# Patient Record
Sex: Male | Born: 2000
Health system: Southern US, Community
[De-identification: ages and names within clinical notes are randomized; demographics above are authoritative.]

---
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
## 2021-11-14 DIAGNOSIS — H5213 Myopia, bilateral: Secondary | ICD-10-CM | POA: Diagnosis not present
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
## 2022-09-13 ENCOUNTER — Encounter: Payer: Self-pay | Admitting: Student

## 2022-09-13 ENCOUNTER — Ambulatory Visit (INDEPENDENT_AMBULATORY_CARE_PROVIDER_SITE_OTHER): Payer: 59 | Admitting: Student

## 2022-09-13 ENCOUNTER — Other Ambulatory Visit (HOSPITAL_COMMUNITY)
Admission: RE | Admit: 2022-09-13 | Discharge: 2022-09-13 | Disposition: A | Discharge status: Home / Self Care | Payer: 59 | Source: Ambulatory Visit | Attending: Family Medicine | Admitting: Family Medicine

## 2022-09-13 VITALS — BP 119/67 | HR 52 | Ht 72.0 in | Wt 203.8 lb

## 2022-09-13 DIAGNOSIS — Z1159 Encounter for screening for other viral diseases: Secondary | ICD-10-CM | POA: Diagnosis not present

## 2022-09-13 DIAGNOSIS — Z113 Encounter for screening for infections with a predominantly sexual mode of transmission: Secondary | ICD-10-CM

## 2022-09-13 DIAGNOSIS — Z0000 Encounter for general adult medical examination without abnormal findings: Secondary | ICD-10-CM | POA: Diagnosis not present

## 2022-09-13 DIAGNOSIS — Z114 Encounter for screening for human immunodeficiency virus [HIV]: Secondary | ICD-10-CM

## 2022-09-13 NOTE — Patient Instructions (Addendum)
It was great to see you! Thank you for allowing me to participate in your care!  Our plans for today:  - Return for lab only visit when able. Labs have been ordered.   We are checking some labs today, I will call you if they are abnormal will send you a MyChart message or a letter if they are normal.  If you do not hear about your labs in the next 2 weeks please let us know.  Take care and seek immediate care sooner if you develop any concerns.   Dr. Erick Alley, DO [facility]

## 2022-09-14 LAB — URINE CYTOLOGY ANCILLARY ONLY
Chlamydia: NEGATIVE
Comment: NEGATIVE
Comment: NEGATIVE
Comment: NORMAL
Neisseria Gonorrhea: NEGATIVE
Trichomonas: NEGATIVE

## 2022-09-17 ENCOUNTER — Encounter: Payer: Self-pay | Admitting: Student
